# Patient Record
Sex: Male | Born: 1995
Health system: Southern US, Community
[De-identification: ages and names within clinical notes are randomized; demographics above are authoritative.]

## PROBLEM LIST (undated history)

## (undated) HISTORY — PX: MANDIBLE FRACTURE SURGERY: SHX706

---
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
## 2019-09-14 ENCOUNTER — Other Ambulatory Visit: Payer: Self-pay

## 2019-09-14 DIAGNOSIS — Z20822 Contact with and (suspected) exposure to covid-19: Secondary | ICD-10-CM

## 2019-09-15 LAB — NOVEL CORONAVIRUS, NAA: SARS-CoV-2, NAA: NOT DETECTED

## 2020-08-25 ENCOUNTER — Other Ambulatory Visit: Payer: Self-pay

## 2020-08-25 DIAGNOSIS — Z20822 Contact with and (suspected) exposure to covid-19: Secondary | ICD-10-CM

## 2020-08-27 LAB — NOVEL CORONAVIRUS, NAA: SARS-CoV-2, NAA: NOT DETECTED

## 2020-09-08 ENCOUNTER — Other Ambulatory Visit: Payer: Self-pay

## 2020-09-22 ENCOUNTER — Ambulatory Visit (INDEPENDENT_AMBULATORY_CARE_PROVIDER_SITE_OTHER): Payer: Self-pay

## 2020-09-22 DIAGNOSIS — Z23 Encounter for immunization: Secondary | ICD-10-CM

## 2021-06-04 ENCOUNTER — Encounter: Payer: Self-pay | Admitting: Family Medicine

## 2021-06-04 ENCOUNTER — Ambulatory Visit (INDEPENDENT_AMBULATORY_CARE_PROVIDER_SITE_OTHER): Payer: No Typology Code available for payment source | Admitting: Family Medicine

## 2021-06-04 ENCOUNTER — Other Ambulatory Visit: Payer: Self-pay

## 2021-06-04 VITALS — BP 120/80 | HR 77 | Ht 68.0 in | Wt 218.2 lb

## 2021-06-04 DIAGNOSIS — Z13220 Encounter for screening for lipoid disorders: Secondary | ICD-10-CM | POA: Diagnosis not present

## 2021-06-04 DIAGNOSIS — Z0000 Encounter for general adult medical examination without abnormal findings: Secondary | ICD-10-CM | POA: Diagnosis not present

## 2021-06-04 DIAGNOSIS — Z1329 Encounter for screening for other suspected endocrine disorder: Secondary | ICD-10-CM | POA: Diagnosis not present

## 2021-06-04 DIAGNOSIS — Z113 Encounter for screening for infections with a predominantly sexual mode of transmission: Secondary | ICD-10-CM

## 2021-06-04 NOTE — Patient Instructions (Signed)
Preventive Care 21-25 Years Old, Male Preventive care refers to lifestyle choices and visits with your health care provider that can promote health and wellness. This includes: A yearly physical exam. This is also called an annual wellness visit. Regular dental and eye exams. Immunizations. Screening for certain conditions. Healthy lifestyle choices, such as: Eating a healthy diet. Getting regular exercise. Not using drugs or products that contain nicotine and tobacco. Limiting alcohol use. What can I expect for my preventive care visit? Physical exam Your health care provider may check your: Height and weight. These may be used to calculate your BMI (body mass index). BMI is a measurement that tells if you are at a healthy weight. Heart rate and blood pressure. Body temperature. Skin for abnormal spots. Counseling Your health care provider may ask you questions about your: Past medical problems. Family's medical history. Alcohol, tobacco, and drug use. Emotional well-being. Home life and relationship well-being. Sexual activity. Diet, exercise, and sleep habits. Work and work environment. Access to firearms. What immunizations do I need?  Vaccines are usually given at various ages, according to a schedule. Your health care provider will recommend vaccines for you based on your age, medicalhistory, and lifestyle or other factors, such as travel or where you work. What tests do I need? Blood tests Lipid and cholesterol levels. These may be checked every 5 years starting at age 20. Hepatitis C test. Hepatitis B test. Screening  Diabetes screening. This is done by checking your blood sugar (glucose) after you have not eaten for a while (fasting). Genital exam to check for testicular cancer or hernias. STD (sexually transmitted disease) testing, if you are at risk. Talk with your health care provider about your test results, treatment options,and if necessary, the need for more  tests. Follow these instructions at home: Eating and drinking  Eat a healthy diet that includes fresh fruits and vegetables, whole grains, lean protein, and low-fat dairy products. Drink enough fluid to keep your urine pale yellow. Take vitamin and mineral supplements as recommended by your health care provider. Do not drink alcohol if your health care provider tells you not to drink. If you drink alcohol: Limit how much you have to 0-2 drinks a day. Be aware of how much alcohol is in your drink. In the [place] one drink equals one 12 oz bottle of beer (355 mL), one 5 oz glass of wine (148 mL), or one 1 oz glass of hard liquor (44 mL).  Lifestyle Take daily care of your teeth and gums. Brush your teeth every morning and night with fluoride toothpaste. Floss one time each day. Stay active. Exercise for at least 30 minutes 5 or more days each week. Do not use any products that contain nicotine or tobacco, such as cigarettes, e-cigarettes, and chewing tobacco. If you need help quitting, ask your health care provider. Do not use drugs. If you are sexually active, practice safe sex. Use a condom or other form of protection to prevent STIs (sexually transmitted infections). Find healthy ways to cope with stress, such as: Meditation, yoga, or listening to music. Journaling. Talking to a trusted person. Spending time with friends and family. Safety Always wear your seat belt while driving or riding in a vehicle. Do not drive: If you have been drinking alcohol. Do not ride with someone who has been drinking. When you are tired or distracted. While texting. Wear a helmet and other protective equipment during sports activities. If you have firearms in your house, make sure   you follow all gun safety procedures. Seek help if you have been physically or sexually abused. What's next? Go to your health care provider once a year for an annual wellness visit. Ask your health care provider how often  you should have your eyes and teeth checked. Stay up to date on all vaccines. This information is not intended to replace advice given to you by your health care provider. Make sure you discuss any questions you have with your healthcare provider. Document Revised: 08/25/2019 Document Reviewed: 12/03/2018 Elsevier Patient Education  2022 Elsevier Inc.  

## 2021-06-04 NOTE — Progress Notes (Signed)
Subjective:    Patient ID: Randy Andrews, male    DOB: 1996/07/13, 25 y.o.   MRN: [id number]  HPI Chief Complaint  Patient presents with   new pt     New pt get established. Needs cpe for work. No concerns   He is new to the practice and here for a complete physical exam. Last CPE: years ago   Denies any significant past medical history and has no concerns today.  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  Alcohol socially Diet: fairly unhealthy, fasting irregular  Exercise: 6 days per week   Immunizations: Tdap - UTD HPV unsure but thinks he is up to date   Health maintenance:   Last Dental Exam: 2 years ago  Last Eye Exam: years ago   Wears seatbelt always, uses sunscreen, smoke detectors in home and functioning, does not text while driving, feels safe in home environment.  Reviewed allergies, medications, past medical, surgical, family, and social history.   Review of Systems Review of Systems Constitutional: -fever, -chills, -sweats, -unexpected weight change,-fatigue ENT: -runny nose, -ear pain, -sore throat Cardiology:  -chest pain, -palpitations, -edema Respiratory: -cough, -shortness of breath, -wheezing Gastroenterology: -abdominal pain, -nausea, -vomiting, -diarrhea, -constipation  Hematology: -bleeding or bruising problems Musculoskeletal: -arthralgias, -myalgias, -joint swelling, -back pain Ophthalmology: -vision changes Urology: -dysuria, -difficulty urinating, -hematuria, -urinary frequency, -urgency Neurology: -headache, -weakness, -tingling, -numbness       Objective:   Physical Exam BP 120/80   Pulse 77   Ht 5\' 8"  (1.727 m)   Wt 218 lb 3.2 oz (99 kg)   BMI 33.18 kg/m   General Appearance:    Alert, cooperative, no distress, appears stated age  Head:    Normocephalic, without obvious abnormality, atraumatic  Eyes:    PERRL, conjunctiva/corneas clear, EOM's intact  Ears:    Normal  TM's and external ear canals  Nose:   Mask on   Throat:   Mask on   Neck:   Supple, no lymphadenopathy;  thyroid:  no   enlargement/tenderness/nodules; no JVD  Back:    Spine nontender, no curvature, ROM normal, no CVA     tenderness  Lungs:     Clear to auscultation bilaterally without wheezes, rales or     ronchi; respirations unlabored  Chest Wall:    No tenderness or deformity   Heart:    Regular rate and rhythm, S1 and S2 normal, no murmur, rub   or gallop  Breast Exam:    No chest wall tenderness, masses or gynecomastia  Abdomen:     Soft, non-tender, nondistended, normoactive bowel sounds,    no masses, no hepatosplenomegaly  Genitalia:  Declines.  Rectal:   Deferred due to age <40 and lack of symptoms  Extremities:   No clubbing, cyanosis or edema  Pulses:   2+ and symmetric all extremities  Skin:   Skin color, texture, turgor normal, no rashes or lesions  Lymph nodes:   Cervical, supraclavicular, and axillary nodes normal  Neurologic:   CNII-XII intact, normal strength, sensation and gait          Psych:   Normal mood, affect, hygiene and grooming.        Assessment & Plan:   Routine general medical examination at a health care facility - Plan: CBC with Differential/Platelet, Comprehensive metabolic panel, Lipid panel  Screen for STD (sexually transmitted disease) - Plan: HIV Antibody (routine testing w rflx), Hepatitis C antibody, RPR,  GC/Chlamydia Probe Amp, Trichomonas vaginalis, RNA  Screening for lipid disorders - Plan: Lipid panel  Screening for thyroid disorder - Plan: TSH, T4, free, T3  He is a pleasant 25 year old male who is new to the practice and here today for fasting CPE.  He is in his usual state of health.  He is in good spirits.  Counseling on healthy lifestyle including diet and exercise.  I recommend regular self testicular exams due to increased risk for his age group.  Recommend regular dental and eye exams.  Discussed immunizations and he may follow-up  for HPV vaccines if he is not up-to-date.  Counseling on safety and health promotion.  Follow-up pending labs or in 1 year.

## 2021-06-05 ENCOUNTER — Encounter: Payer: Self-pay | Admitting: Family Medicine

## 2021-06-05 DIAGNOSIS — E7800 Pure hypercholesterolemia, unspecified: Secondary | ICD-10-CM | POA: Insufficient documentation

## 2021-06-05 HISTORY — DX: Pure hypercholesterolemia, unspecified: E78.00

## 2021-06-05 LAB — CBC WITH DIFFERENTIAL/PLATELET
Basophils Absolute: 0 10*3/uL (ref 0.0–0.2)
Basos: 0 %
EOS (ABSOLUTE): 0.1 10*3/uL (ref 0.0–0.4)
Eos: 1 %
Hematocrit: 46.2 % (ref 37.5–51.0)
Hemoglobin: 15 g/dL (ref 13.0–17.7)
Immature Grans (Abs): 0 10*3/uL (ref 0.0–0.1)
Immature Granulocytes: 0 %
Lymphocytes Absolute: 1.5 10*3/uL (ref 0.7–3.1)
Lymphs: 13 %
MCH: 27.8 pg (ref 26.6–33.0)
MCHC: 32.5 g/dL (ref 31.5–35.7)
MCV: 86 fL (ref 79–97)
Monocytes Absolute: 0.9 10*3/uL (ref 0.1–0.9)
Monocytes: 7 %
Neutrophils Absolute: 9.1 10*3/uL — ABNORMAL HIGH (ref 1.4–7.0)
Neutrophils: 79 %
Platelets: 295 10*3/uL (ref 150–450)
RBC: 5.39 x10E6/uL (ref 4.14–5.80)
RDW: 12.9 % (ref 11.6–15.4)
WBC: 11.6 10*3/uL — ABNORMAL HIGH (ref 3.4–10.8)

## 2021-06-05 LAB — COMPREHENSIVE METABOLIC PANEL
ALT: 22 IU/L (ref 0–44)
AST: 30 IU/L (ref 0–40)
Albumin/Globulin Ratio: 1.8 (ref 1.2–2.2)
Albumin: 4.9 g/dL (ref 4.1–5.2)
Alkaline Phosphatase: 116 IU/L (ref 44–121)
BUN/Creatinine Ratio: 12 (ref 9–20)
BUN: 12 mg/dL (ref 6–20)
Bilirubin Total: 0.3 mg/dL (ref 0.0–1.2)
CO2: 22 mmol/L (ref 20–29)
Calcium: 9.8 mg/dL (ref 8.7–10.2)
Chloride: 101 mmol/L (ref 96–106)
Creatinine, Ser: 1.01 mg/dL (ref 0.76–1.27)
Globulin, Total: 2.8 g/dL (ref 1.5–4.5)
Glucose: 87 mg/dL (ref 65–99)
Potassium: 4.7 mmol/L (ref 3.5–5.2)
Sodium: 141 mmol/L (ref 134–144)
Total Protein: 7.7 g/dL (ref 6.0–8.5)
eGFR: 107 mL/min/{1.73_m2} (ref >59)

## 2021-06-05 LAB — LIPID PANEL
Chol/HDL Ratio: 3.7 ratio (ref 0.0–5.0)
Cholesterol, Total: 213 mg/dL — ABNORMAL HIGH (ref 100–199)
HDL: 57 mg/dL (ref >39)
LDL Chol Calc (NIH): 137 mg/dL — ABNORMAL HIGH (ref 0–99)
Triglycerides: 106 mg/dL (ref 0–149)
VLDL Cholesterol Cal: 19 mg/dL (ref 5–40)

## 2021-06-05 LAB — HIV ANTIBODY (ROUTINE TESTING W REFLEX): HIV Screen 4th Generation wRfx: NONREACTIVE

## 2021-06-05 LAB — RPR: RPR Ser Ql: NONREACTIVE

## 2021-06-05 LAB — TSH: TSH: 2.72 u[IU]/mL (ref 0.450–4.500)

## 2021-06-05 LAB — T3: T3, Total: 144 ng/dL (ref 71–180)

## 2021-06-05 LAB — T4, FREE: Free T4: 1.25 ng/dL (ref 0.82–1.77)

## 2021-06-05 LAB — HEPATITIS C ANTIBODY: Hep C Virus Ab: 0.2 s/co ratio (ref 0.0–0.9)

## 2021-06-06 LAB — TRICHOMONAS VAGINALIS, PROBE AMP: Trich vag by NAA: NEGATIVE

## 2021-06-06 LAB — GC/CHLAMYDIA PROBE AMP
Chlamydia trachomatis, NAA: NEGATIVE
Neisseria Gonorrhoeae by PCR: NEGATIVE

## 2021-06-10 NOTE — Progress Notes (Signed)
I received a message that he has not seen his mychart results or my recommendations. Please check

## 2022-04-22 ENCOUNTER — Ambulatory Visit: Payer: Self-pay | Admitting: Family Medicine

## 2022-06-07 ENCOUNTER — Encounter: Payer: No Typology Code available for payment source | Admitting: Family Medicine

## 2022-06-12 ENCOUNTER — Encounter: Payer: Self-pay | Admitting: Physician Assistant

## 2022-06-12 ENCOUNTER — Ambulatory Visit (INDEPENDENT_AMBULATORY_CARE_PROVIDER_SITE_OTHER): Payer: No Typology Code available for payment source | Admitting: Physician Assistant

## 2022-06-12 VITALS — BP 130/90 | HR 77 | Ht 68.0 in | Wt 216.4 lb

## 2022-06-12 DIAGNOSIS — D72829 Elevated white blood cell count, unspecified: Secondary | ICD-10-CM

## 2022-06-12 DIAGNOSIS — Z113 Encounter for screening for infections with a predominantly sexual mode of transmission: Secondary | ICD-10-CM

## 2022-06-12 DIAGNOSIS — Z0000 Encounter for general adult medical examination without abnormal findings: Secondary | ICD-10-CM

## 2022-06-12 DIAGNOSIS — E785 Hyperlipidemia, unspecified: Secondary | ICD-10-CM | POA: Diagnosis not present

## 2022-06-12 NOTE — Patient Instructions (Signed)
Preventive Care 21-26 Years Old, Male Preventive care refers to lifestyle choices and visits with your health care provider that can promote health and wellness. This includes: A yearly physical exam. This is also called an annual wellness visit. Regular dental and eye exams. Immunizations. Screening for certain conditions. Healthy lifestyle choices, such as: Eating a healthy diet. Getting regular exercise. Not using drugs or products that contain nicotine and tobacco. Limiting alcohol use. What can I expect for my preventive care visit? Physical exam Your health care provider may check your: Height and weight. These may be used to calculate your BMI (body mass index). BMI is a measurement that tells if you are at a healthy weight. Heart rate and blood pressure. Body temperature. Skin for abnormal spots. Counseling Your health care provider may ask you questions about your: Past medical problems. Family's medical history. Alcohol, tobacco, and drug use. Emotional well-being. Home life and relationship well-being. Sexual activity. Diet, exercise, and sleep habits. Work and work environment. Access to firearms. What immunizations do I need?  Vaccines are usually given at various ages, according to a schedule. Your health care provider will recommend vaccines for you based on your age, medicalhistory, and lifestyle or other factors, such as travel or where you work. What tests do I need? Blood tests Lipid and cholesterol levels. These may be checked every 5 years starting at age 20. Hepatitis C test. Hepatitis B test. Screening  Diabetes screening. This is done by checking your blood sugar (glucose) after you have not eaten for a while (fasting). Genital exam to check for testicular cancer or hernias. STD (sexually transmitted disease) testing, if you are at risk. Talk with your health care provider about your test results, treatment options,and if necessary, the need for more  tests. Follow these instructions at home: Eating and drinking  Eat a healthy diet that includes fresh fruits and vegetables, whole grains, lean protein, and low-fat dairy products. Drink enough fluid to keep your urine pale yellow. Take vitamin and mineral supplements as recommended by your health care provider. Do not drink alcohol if your health care provider tells you not to drink. If you drink alcohol: Limit how much you have to 0-2 drinks a day. Be aware of how much alcohol is in your drink. In the [place] one drink equals one 12 oz bottle of beer (355 mL), one 5 oz glass of wine (148 mL), or one 1 oz glass of hard liquor (44 mL).  Lifestyle Take daily care of your teeth and gums. Brush your teeth every morning and night with fluoride toothpaste. Floss one time each day. Stay active. Exercise for at least 30 minutes 5 or more days each week. Do not use any products that contain nicotine or tobacco, such as cigarettes, e-cigarettes, and chewing tobacco. If you need help quitting, ask your health care provider. Do not use drugs. If you are sexually active, practice safe sex. Use a condom or other form of protection to prevent STIs (sexually transmitted infections). Find healthy ways to cope with stress, such as: Meditation, yoga, or listening to music. Journaling. Talking to a trusted person. Spending time with friends and family. Safety Always wear your seat belt while driving or riding in a vehicle. Do not drive: If you have been drinking alcohol. Do not ride with someone who has been drinking. When you are tired or distracted. While texting. Wear a helmet and other protective equipment during sports activities. If you have firearms in your house, make sure   you follow all gun safety procedures. Seek help if you have been physically or sexually abused. What's next? Go to your health care provider once a year for an annual wellness visit. Ask your health care provider how often  you should have your eyes and teeth checked. Stay up to date on all vaccines. This information is not intended to replace advice given to you by your health care provider. Make sure you discuss any questions you have with your healthcare provider. Document Revised: 08/25/2019 Document Reviewed: 12/03/2018 Elsevier Patient Education  2022 Elsevier Inc.  

## 2022-06-12 NOTE — Progress Notes (Unsigned)
statin  Complete physical exam   Patient: Randy Andrews   DOB: 01-Dec-1996   25 y.o. Male  MRN: [id number] Visit Date: 06/12/2022  Chief Complaint  Patient presents with   Annual Exam    Fasting CPE- No concerns   Subjective    Randy Andrews is a 26 y.o. male who presents today for a complete physical exam.   Reports is generally feeling well; is eating a intermittent fasting/healthy diet; is sleeping well 4 - 5; drinks 60+ ounces bottles of water a day; is exercising walking dog   HPI HPI     Annual Exam    Additional comments: Fasting CPE- No concerns      Last edited by Sammuel Cooper, CMA on 06/12/2022  8:23 AM.      ***  Past Medical History:  Diagnosis Date   Elevated LDL cholesterol level 06/05/2021   Past Surgical History:  Procedure Laterality Date   MANDIBLE FRACTURE SURGERY     Social History   Socioeconomic History   Marital status: Single    Spouse name: Not on file   Number of children: Not on file   Years of education: Not on file   Highest education level: Not on file  Occupational History   Not on file  Tobacco Use   Smoking status: Unknown   Smokeless tobacco: Not on file  Substance and Sexual Activity   Alcohol use: Yes   Drug use: Never   Sexual activity: Yes  Other Topics Concern   Not on file  Social History Narrative   Not on file   Social Determinants of Health   Financial Resource Strain: Not on file  Food Insecurity: Not on file  Transportation Needs: Not on file  Physical Activity: Not on file  Stress: Not on file  Social Connections: Not on file  Intimate Partner Violence: Not on file   Family Status  Relation Name Status   Mother  Alive   Father  Alive   Brother 3 Alive   MGM  Alive   Family History  Problem Relation Age of Onset   Depression Mother    Cancer Maternal Grandmother    No Known Allergies  Patient Care Team: Lexine Baton as PCP - General (Physician Assistant)   Medications: No  outpatient medications prior to visit.   No facility-administered medications prior to visit.    Review of Systems  {Labs (Optional):23779}  The ASCVD Risk score (Arnett DK, et al., 2019) failed to calculate for the following reasons:   The 2019 ASCVD risk score is only valid for ages 43 to 21   Objective    BP 130/90   Pulse 77   Wt 216 lb 6.4 oz (98.2 kg)   SpO2 99%   BMI 32.90 kg/m   {Show previous vital signs (optional):23777}   Physical Exam  ***  Last depression screening scores    06/12/2022    8:23 AM 06/04/2021    8:08 AM  PHQ 2/9 Scores  PHQ - 2 Score 0 0   Last fall risk screening    06/12/2022    8:23 AM  Fall Risk   Falls in the past year? 0  Number falls in past yr: 0  Injury with Fall? 0  Risk for fall due to : No Fall Risks  Follow up Falls evaluation completed     No results found for any visits on 06/12/22.  Assessment & Plan    Routine Health  Maintenance and Physical Exam  Exercise Activities and Dietary recommendations  Goals   None     Immunization History  Administered Date(s) Administered   PFIZER(Purple Top)SARS-COV-2 Vaccination 12/28/2019, 01/17/2020, 09/22/2020   PPD Test 05/08/2018    Health Maintenance  Topic Date Due   HPV VACCINES (1 - Male 2-dose series) Never done   TETANUS/TDAP  Never done   COVID-19 Vaccine (4 - Pfizer series) 06/28/2022 (Originally 11/17/2020)   INFLUENZA VACCINE  07/23/2022   Hepatitis C Screening  Completed   HIV Screening  Completed    Discussed health benefits of physical activity, and encouraged him to engage in regular exercise appropriate for his age and condition.  Problem List Items Addressed This Visit   None    No follow-ups on file.     Jake Shark, PA-C

## 2022-06-14 LAB — COMPREHENSIVE METABOLIC PANEL
ALT: 27 IU/L (ref 0–44)
AST: 21 IU/L (ref 0–40)
Albumin/Globulin Ratio: 1.6 (ref 1.2–2.2)
Albumin: 4.9 g/dL (ref 4.1–5.2)
Alkaline Phosphatase: 101 IU/L (ref 44–121)
BUN/Creatinine Ratio: 13 (ref 9–20)
BUN: 11 mg/dL (ref 6–20)
Bilirubin Total: 0.4 mg/dL (ref 0.0–1.2)
CO2: 24 mmol/L (ref 20–29)
Calcium: 9.8 mg/dL (ref 8.7–10.2)
Chloride: 101 mmol/L (ref 96–106)
Creatinine, Ser: 0.87 mg/dL (ref 0.76–1.27)
Globulin, Total: 3 g/dL (ref 1.5–4.5)
Glucose: 93 mg/dL (ref 70–99)
Potassium: 5 mmol/L (ref 3.5–5.2)
Sodium: 138 mmol/L (ref 134–144)
Total Protein: 7.9 g/dL (ref 6.0–8.5)
eGFR: 123 mL/min/{1.73_m2} (ref >59)

## 2022-06-14 LAB — CBC WITH DIFFERENTIAL/PLATELET
Basophils Absolute: 0.1 10*3/uL (ref 0.0–0.2)
Basos: 1 %
EOS (ABSOLUTE): 0.1 10*3/uL (ref 0.0–0.4)
Eos: 2 %
Hematocrit: 43.8 % (ref 37.5–51.0)
Hemoglobin: 14.9 g/dL (ref 13.0–17.7)
Immature Grans (Abs): 0 10*3/uL (ref 0.0–0.1)
Immature Granulocytes: 0 %
Lymphocytes Absolute: 2.6 10*3/uL (ref 0.7–3.1)
Lymphs: 37 %
MCH: 28.3 pg (ref 26.6–33.0)
MCHC: 34 g/dL (ref 31.5–35.7)
MCV: 83 fL (ref 79–97)
Monocytes Absolute: 0.6 10*3/uL (ref 0.1–0.9)
Monocytes: 9 %
Neutrophils Absolute: 3.5 10*3/uL (ref 1.4–7.0)
Neutrophils: 51 %
Platelets: 303 10*3/uL (ref 150–450)
RBC: 5.27 x10E6/uL (ref 4.14–5.80)
RDW: 12.4 % (ref 11.6–15.4)
WBC: 6.8 10*3/uL (ref 3.4–10.8)

## 2022-06-14 LAB — RPR+HIV+GC+CT PANEL
Chlamydia trachomatis, NAA: NEGATIVE
HIV Screen 4th Generation wRfx: NONREACTIVE
Neisseria Gonorrhoeae by PCR: NEGATIVE
RPR Ser Ql: NONREACTIVE

## 2022-06-14 LAB — LIPID PANEL
Chol/HDL Ratio: 4.7 ratio (ref 0.0–5.0)
Cholesterol, Total: 231 mg/dL — ABNORMAL HIGH (ref 100–199)
HDL: 49 mg/dL (ref >39)
LDL Chol Calc (NIH): 163 mg/dL — ABNORMAL HIGH (ref 0–99)
Triglycerides: 105 mg/dL (ref 0–149)
VLDL Cholesterol Cal: 19 mg/dL (ref 5–40)

## 2022-09-12 ENCOUNTER — Ambulatory Visit (INDEPENDENT_AMBULATORY_CARE_PROVIDER_SITE_OTHER): Payer: No Typology Code available for payment source | Admitting: Medical

## 2022-09-12 VITALS — BP 120/80 | HR 82 | Wt 212.0 lb

## 2022-09-12 DIAGNOSIS — G479 Sleep disorder, unspecified: Secondary | ICD-10-CM | POA: Diagnosis not present

## 2022-09-12 DIAGNOSIS — R0683 Snoring: Secondary | ICD-10-CM | POA: Diagnosis not present

## 2022-09-12 DIAGNOSIS — E7800 Pure hypercholesterolemia, unspecified: Secondary | ICD-10-CM

## 2022-09-12 DIAGNOSIS — L210 Seborrhea capitis: Secondary | ICD-10-CM | POA: Diagnosis not present

## 2022-09-12 MED ORDER — KETOCONAZOLE 2 % EX SHAM: 1.0000 | MEDICATED_SHAMPOO | CUTANEOUS | 0 refills | Status: DC

## 2022-09-12 NOTE — Progress Notes (Signed)
Subjective: Chief Complaint  Patient presents with   mulitiple issues    Multiple issues- dandruff all the time- over a year but getting worse and itching. Discuss slee apnea- snoring loud, and stopped breathing in sleep, gasp for her, follow-up on cholesterol   Here for several issues.   This is my first visit with him today  Has concerns for sleep apnea.  For years has been told about loud snoring from friends and mother.  He endorses daytime somnolence.  Some days feels rested, sometimes not.   Works schedule can interfere with sleep schedule as well.   There has been concern about apnea or gasping.    Works first shift, but sometimes has trouble getting to sleep.   Exercises sometimes late evening to wear him self and make him tired.   No prior sleep study. No family members with sleep apnea.   Here for f/u on cholesterol.   No prior medication.  Since last visit is working to do better with low cholesterol diet.   Using a lot more cooking at home.   No family hx/o heart disease.  Using [employer] meal prep.  He notes concerns about dandruff.  He has long wavy hair.  He has tried various things in the past including [employer], Shea butter, over-the-counter psoriasis shampoo and others.  Washing his hair more frequently seems to make it worse.  Sometimes worse sometimes better overall despite what he is use.  Past Medical History:  Diagnosis Date   Elevated LDL cholesterol level 06/05/2021   No current outpatient medications on file prior to visit.   No current facility-administered medications on file prior to visit.   ROS as in subjective    Objective: BP 120/80   Pulse 82   Wt 212 lb (96.2 kg)   BMI 32.23 kg/m   Wt Readings from Last 3 Encounters:  09/12/22 212 lb (96.2 kg)  06/12/22 216 lb 6.4 oz (98.2 kg)  06/04/21 218 lb 3.2 oz (99 kg)   General appearence: alert, no distress, WD/WN,  He does have some large tonsils but has a open airway Neck: supple, no  lymphadenopathy, no thyromegaly, no masses Heart: RRR, normal S1, S2, no murmurs Lungs: CTA bilaterally, no wheezes, rhonchi, or rales Pulses: 2+ symmetric, upper and lower extremities, normal cap refill Skin: Along the perimeter of the scalp and within the scalp he has an delineated area of pinkish-red irritated skin but no obvious flaking or scaling      Assessment: Encounter Diagnoses  Name Primary?   Snoring Yes   Sleep disturbance    Elevated LDL cholesterol level    Dandruff      Plan: Snoring, sleep disturbance - referral for home sleep study, work on weight loss, avoid supine position in bed.  Discussed next steps  Elevated LDL - he has been working on dietary changes, regular exercise.  Continue these measures.  Recheck lipids next year.  Dandruff- begin Nizoral shampoo twice weekly.  Discussed proper use of medication   Randy Andrews was seen today for mulitiple issues.  Diagnoses and all orders for this visit:  Snoring  Sleep disturbance  Elevated LDL cholesterol level  Dandruff  Other orders -     ketoconazole (NIZORAL) 2 % shampoo; Apply 1 Application topically 2 (two) times a week.    F/u pending sleep study

## 2022-09-12 NOTE — Progress Notes (Signed)
I have put in order for home sleep test through [facility] since he has [employer]

## 2022-11-05 ENCOUNTER — Encounter: Payer: Self-pay | Admitting: Internal Medicine

## 2023-03-23 ENCOUNTER — Other Ambulatory Visit: Payer: Self-pay | Admitting: Medical

## 2023-03-24 ENCOUNTER — Other Ambulatory Visit: Payer: Self-pay | Admitting: Internal Medicine

## 2023-03-24 MED ORDER — KETOCONAZOLE 2 % EX SHAM: 1.0000 | MEDICATED_SHAMPOO | CUTANEOUS | 0 refills | Status: DC

## 2023-03-24 NOTE — Telephone Encounter (Signed)
From: [place] Gave To: Office of Dorothea Ogle, [place] Sent: 03/20/2023 10:31 PM EDT Subject: Medication Renewal Request  Refills have been requested for the following medications:   ketoconazole (NIZORAL) 2 % shampoo [Shane Tysinger]  Preferred pharmacy: [id number] - [address], [address] AT [facility] OF [address] [facility] Delivery method: [employer]

## 2023-08-08 ENCOUNTER — Encounter: Payer: Self-pay | Admitting: Medical

## 2023-08-08 ENCOUNTER — Ambulatory Visit (INDEPENDENT_AMBULATORY_CARE_PROVIDER_SITE_OTHER): Payer: 59 | Admitting: Medical

## 2023-08-08 VITALS — BP 110/70 | HR 79 | Wt 224.8 lb

## 2023-08-08 DIAGNOSIS — Z13220 Encounter for screening for lipoid disorders: Secondary | ICD-10-CM

## 2023-08-08 DIAGNOSIS — Z0000 Encounter for general adult medical examination without abnormal findings: Secondary | ICD-10-CM

## 2023-08-08 DIAGNOSIS — Z113 Encounter for screening for infections with a predominantly sexual mode of transmission: Secondary | ICD-10-CM

## 2023-08-08 DIAGNOSIS — Z7185 Encounter for immunization safety counseling: Secondary | ICD-10-CM

## 2023-08-08 NOTE — Progress Notes (Signed)
Subjective:   HPI  Randy Andrews is a 27 y.o. male who presents for Chief Complaint  Patient presents with   nonfasting cpe     Nonfasting cpe, no concerns    Patient Care Team: Hasini Peachey, Cleda Mccreedy as PCP - General (Family Medicine) No eye doctor Sees dentist   Concerns: Non fasting.   Ate a taco on the way here but otherwise NPO since yesterday  Eats a lot of steak, not a lot of fruits and vegetables   Reviewed their medical, surgical, family, social, medication, and allergy history and updated chart as appropriate.  No Known Allergies  Past Medical History:  Diagnosis Date   Elevated LDL cholesterol level 06/05/2021    Current Outpatient Medications on File Prior to Visit  Medication Sig Dispense Refill   ketoconazole (NIZORAL) 2 % shampoo Apply 1 Application topically 2 (two) times a week. (Patient not taking: Reported on 08/08/2023) 120 mL 0   No current facility-administered medications on file prior to visit.      Current Outpatient Medications:    ketoconazole (NIZORAL) 2 % shampoo, Apply 1 Application topically 2 (two) times a week. (Patient not taking: Reported on 08/08/2023), Disp: 120 mL, Rfl: 0  Family History  Problem Relation Age of Onset   Depression Mother    Hyperlipidemia Father    Cancer Maternal Grandmother    Diabetes type II Neg Hx    Heart attack Neg Hx    Stroke Neg Hx    Hypertension Neg Hx    Prostate cancer Neg Hx     Past Surgical History:  Procedure Laterality Date   MANDIBLE FRACTURE SURGERY      Review of Systems  Constitutional:  Negative for chills, fever, malaise/fatigue and weight loss.  HENT:  Negative for congestion, ear pain, hearing loss, sore throat and tinnitus.   Eyes:  Negative for blurred vision, pain and redness.  Respiratory:  Negative for cough, hemoptysis and shortness of breath.   Cardiovascular:  Negative for chest pain, palpitations, orthopnea, claudication and leg swelling.  Gastrointestinal:   Negative for abdominal pain, blood in stool, constipation, diarrhea, nausea and vomiting.  Genitourinary:  Negative for dysuria, flank pain, frequency, hematuria and urgency.  Musculoskeletal:  Negative for falls, joint pain and myalgias.  Skin:  Negative for itching and rash.  Neurological:  Negative for dizziness, tingling, speech change, weakness and headaches.  Endo/Heme/Allergies:  Negative for polydipsia. Does not bruise/bleed easily.  Psychiatric/Behavioral:  Negative for depression and memory loss. The patient is not nervous/anxious and does not have insomnia.         Objective:  BP 110/70   Pulse 79   Wt 224 lb 12.8 oz (102 kg)   BMI 34.18 kg/m   General appearance: alert, no distress, WD/WN, male Skin: unremarkable HEENT: normocephalic, conjunctiva/corneas normal, sclerae anicteric, PERRLA, EOMi, nares patent, no discharge or erythema, pharynx normal Oral cavity: MMM, tongue normal, teeth normal Neck: supple, no lymphadenopathy, no thyromegaly, no masses, normal ROM, no bruits Chest: non tender, normal shape and expansion Heart: RRR, normal S1, S2, no murmurs Lungs: CTA bilaterally, no wheezes, rhonchi, or rales Abdomen: +bs, soft, non tender, non distended, no masses, no hepatomegaly, no splenomegaly, no bruits Back: non tender, normal ROM, no scoliosis Musculoskeletal: upper extremities non tender, no obvious deformity, normal ROM throughout, lower extremities non tender, no obvious deformity, normal ROM throughout Extremities: no edema, no cyanosis, no clubbing Pulses: 2+ symmetric, upper and lower extremities, normal cap refill Neurological: alert,  oriented x 3, CN2-12 intact, strength normal upper extremities and lower extremities, sensation normal throughout, DTRs 2+ throughout, no cerebellar signs, gait normal Psychiatric: normal affect, behavior normal, pleasant  GU: normal male external genitalia,uncircumcised, nontender, no masses, no hernia, no  lymphadenopathy Rectal: deferred    Assessment and Plan :   Encounter Diagnoses  Name Primary?   Encounter for health maintenance examination in adult Yes   Screening for lipid disorders    Vaccine counseling    Screen for STD (sexually transmitted disease)     This visit was a preventative care visit, also known as wellness visit or routine physical.   Topics typically include healthy lifestyle, diet, exercise, preventative care, vaccinations, sick and well care, proper use of emergency dept and after hours care, as well as other concerns.     Separate significant issues discussed: Counseled on diet, try to get more fruits and vegetables in diet, less meat and less high cholesterol foods    General Recommendations: Continue to return yearly for your annual wellness and preventative care visits.  This gives [place] a chance to discuss healthy lifestyle, exercise, vaccinations, review your chart record, and perform screenings where appropriate.  I recommend you see your eye doctor yearly for routine vision care.  I recommend you see your dentist yearly for routine dental care including hygiene visits twice yearly.   Vaccination  Immunization History  Administered Date(s) Administered   PFIZER(Purple Top)SARS-COV-2 Vaccination 12/28/2019, 01/17/2020, 09/22/2020   PPD Test 05/08/2018   Tdap 06/20/2020    Vaccine recommendations: Consider yearly flu vaccine    Screening for cancer: Colon cancer screening: Age 1  Testicular cancer screening You should do a monthly self testicular exam if you are between 1-27 years old, and we typically do a testicular exam on the yearly physical for this same age group.   Prostate Cancer screening: The recommended prostate cancer screening test is a blood test called the prostate-specific antigen (PSA) test. PSA is a protein that is made in the prostate. As you age, your prostate naturally produces more PSA. Abnormally high PSA levels may  be caused by: Prostate cancer. An enlarged prostate that is not caused by cancer (benign prostatic hyperplasia, or BPH). This condition is very common in older men. A prostate gland infection (prostatitis) or urinary tract infection. Certain medicines such as male hormones (like testosterone) or other medicines that raise testosterone levels. A rectal exam may be done as part of prostate cancer screening to help provide information about the size of your prostate gland. When a rectal exam is performed, it should be done after the PSA level is drawn to avoid any effect on the results.   Skin cancer screening: Check your skin regularly for new changes, growing lesions, or other lesions of concern Come in for evaluation if you have skin lesions of concern.   Lung cancer screening: If you have a greater than 20 pack year history of tobacco use, then you may qualify for lung cancer screening with a chest CT scan.   Please call your insurance company to inquire about coverage for this test.   Pancreatic cancer:  no current screening test is available or routinely recommended. (risk factors: smoking, overweight or obese, diabetes, chronic pancreatitis, work exposure - dry cleaning, metal working, 27yo>, M>F, Tree surgeon, family hx/o, hereditary breast, ovarian, melanoma, lynch, peutz-jeghers).  Symptoms: jaundice, dark urine, light color or greasy stools, itchy skin, belly or back pain, weight loss, poor appetite, nausea, vomiting, liver enlargement, DVT/blood  clots.   We currently don't have screenings for other cancers besides breast, cervical, colon, and lung cancers.  If you have a strong family history of cancer or have other cancer screening concerns, please let me know.  Genetic testing referral is an option for individuals with high cancer risk in the family.  There are some other cancer screenings in development currently.   Bone health: Get at least 150 minutes of aerobic exercise  weekly Get weight bearing exercise at least once weekly Bone density test:  A bone density test is an imaging test that uses a type of X-ray to measure the amount of calcium and other minerals in your bones. The test may be used to diagnose or screen you for a condition that causes weak or thin bones (osteoporosis), predict your risk for a broken bone (fracture), or determine how well your osteoporosis treatment is working. The bone density test is recommended for females 65 and older, or females or males <65 if certain risk factors such as thyroid disease, long term use of steroids such as for asthma or rheumatological issues, vitamin D deficiency, estrogen deficiency, family history of osteoporosis, self or family history of fragility fracture in first degree relative.    Heart health: Get at least 150 minutes of aerobic exercise weekly Limit alcohol It is important to maintain a healthy blood pressure and healthy cholesterol numbers  Heart disease screening: Screening for heart disease includes screening for blood pressure, fasting lipids, glucose/diabetes screening, BMI height to weight ratio, reviewed of smoking status, physical activity, and diet.    Goals include blood pressure 120/80 or less, maintaining a healthy lipid/cholesterol profile, preventing diabetes or keeping diabetes numbers under good control, not smoking or using tobacco products, exercising most days per week or at least 150 minutes per week of exercise, and eating healthy variety of fruits and vegetables, healthy oils, and avoiding unhealthy food choices like fried food, fast food, high sugar and high cholesterol foods.     Vascular disease screening: For higher risk individuals including smokers, diabetics, patients with known heart disease or high blood pressure, kidney disease, and others, screening for vascular disease or atherosclerosis of the arteries is available.  Examples may include carotid ultrasound,  abdominal aortic ultrasound, ABI blood flow screening in the legs, thoracic aorta screening.   Medical care options: I recommend you continue to seek care here first for routine care.  We try really hard to have available appointments Monday through Friday daytime hours for sick visits, acute visits, and physicals.  Urgent care should be used for after hours and weekends for significant issues that cannot wait till the next day.  The emergency department should be used for significant potentially life-threatening emergencies.  The emergency department is expensive, can often have long wait times for less significant concerns, so try to utilize primary care, urgent care, or telemedicine when possible to avoid unnecessary trips to the emergency department.  Virtual visits and telemedicine have been introduced since the pandemic started in 2020, and can be convenient ways to receive medical care.  We offer virtual appointments as well to assist you in a variety of options to seek medical care.   Legal  Take the time to do a last will and testament, Advanced Directives including Health Care Power of Attorney and Living Will documents.  Don't leave your family with burdens that can be handled ahead of time.   Spiritual and Emotional Health Keeping a healthy spiritual life can help you better  manage your physical health. Your spiritual life can help you to cope with any issues that may arise with your physical health.  Balance can keep [place] healthy and help [place] to recover.  If you are struggling with your spiritual health there are questions that you may want to ask yourself:  What makes me feel most complete? When do I feel most connected to the rest of the world? Where do I find the most inner strength? What am I doing when I feel whole?  Helpful tips: Being in nature. Some people feel very connected and at peace when they are walking outdoors or are outside. Helping others. Some feel the largest  sense of wellbeing when they are of service to others. Being of service can take on many forms. It can be doing volunteer work, being kind to strangers, or offering a hand to a friend in need. Gratitude. Some people find they feel the most connected when they remain grateful. They may make lists of all the things they are grateful for or say a thank you out loud for all they have.    Emotional Health Are you in tune with your emotional health?  Check out this link: http://www.marquez-love.com/    Financial Health Make sure you use a budget for your personal finances Make sure you are insured against risks (health insurance, life insurance, auto insurance, etc) Save more, spend less Set financial goals If you need help in this area, good resources include counseling through [employer] or other community resources, have a meeting with a Social research officer, government, and a good resource is the [facility] "Marv" was seen today for nonfasting cpe .  Diagnoses and all orders for this visit:  Encounter for health maintenance examination in adult -     Comprehensive metabolic panel -     CBC -     Lipid panel -     TSH -     RPR+HIV+GC+CT Panel -     Hepatitis C antibody -     Hepatitis B surface antigen  Screening for lipid disorders -     Lipid panel  Vaccine counseling  Screen for STD (sexually transmitted disease) -     RPR+HIV+GC+CT Panel -     Hepatitis C antibody -     Hepatitis B surface antigen     Follow-up pending labs, yearly for physical

## 2023-08-11 NOTE — Progress Notes (Signed)
Results sent through MyChart

## 2023-08-13 LAB — LIPID PANEL
Chol/HDL Ratio: 5.3 ratio — ABNORMAL HIGH (ref 0.0–5.0)
Cholesterol, Total: 255 mg/dL — ABNORMAL HIGH (ref 100–199)
HDL: 48 mg/dL (ref >39)
LDL Chol Calc (NIH): 178 mg/dL — ABNORMAL HIGH (ref 0–99)
Triglycerides: 158 mg/dL — ABNORMAL HIGH (ref 0–149)
VLDL Cholesterol Cal: 29 mg/dL (ref 5–40)

## 2023-08-13 LAB — COMPREHENSIVE METABOLIC PANEL
ALT: 32 IU/L (ref 0–44)
AST: 33 IU/L (ref 0–40)
Albumin: 4.8 g/dL (ref 4.3–5.2)
Alkaline Phosphatase: 104 IU/L (ref 44–121)
BUN/Creatinine Ratio: 14 (ref 9–20)
BUN: 15 mg/dL (ref 6–20)
Bilirubin Total: 0.5 mg/dL (ref 0.0–1.2)
CO2: 23 mmol/L (ref 20–29)
Calcium: 9.9 mg/dL (ref 8.7–10.2)
Chloride: 101 mmol/L (ref 96–106)
Creatinine, Ser: 1.08 mg/dL (ref 0.76–1.27)
Globulin, Total: 3 g/dL (ref 1.5–4.5)
Glucose: 94 mg/dL (ref 70–99)
Potassium: 4.2 mmol/L (ref 3.5–5.2)
Sodium: 140 mmol/L (ref 134–144)
Total Protein: 7.8 g/dL (ref 6.0–8.5)
eGFR: 96 mL/min/{1.73_m2} (ref >59)

## 2023-08-13 LAB — CBC
Hematocrit: 43.3 % (ref 37.5–51.0)
Hemoglobin: 14.4 g/dL (ref 13.0–17.7)
MCH: 28.1 pg (ref 26.6–33.0)
MCHC: 33.3 g/dL (ref 31.5–35.7)
MCV: 84 fL (ref 79–97)
Platelets: 313 10*3/uL (ref 150–450)
RBC: 5.13 x10E6/uL (ref 4.14–5.80)
RDW: 12.7 % (ref 11.6–15.4)
WBC: 7.5 10*3/uL (ref 3.4–10.8)

## 2023-08-13 LAB — RPR+HIV+GC+CT PANEL
Chlamydia trachomatis, NAA: NEGATIVE
HIV Screen 4th Generation wRfx: NONREACTIVE
Neisseria Gonorrhoeae by PCR: NEGATIVE
RPR Ser Ql: NONREACTIVE

## 2023-08-13 LAB — HEPATITIS C ANTIBODY: Hep C Virus Ab: NONREACTIVE

## 2023-08-13 LAB — HEPATITIS B SURFACE ANTIGEN: Hepatitis B Surface Ag: NEGATIVE

## 2023-08-13 LAB — TSH: TSH: 1.88 u[IU]/mL (ref 0.450–4.500)

## 2023-08-13 NOTE — Progress Notes (Signed)
Results sent through MyChart

## 2023-12-26 ENCOUNTER — Ambulatory Visit: Payer: 59 | Admitting: Medical

## 2023-12-26 ENCOUNTER — Encounter: Payer: Self-pay | Admitting: Medical

## 2023-12-26 ENCOUNTER — Other Ambulatory Visit: Payer: Self-pay

## 2023-12-26 VITALS — BP 112/74 | HR 70 | Ht 67.0 in | Wt 215.2 lb

## 2023-12-26 DIAGNOSIS — R0683 Snoring: Secondary | ICD-10-CM

## 2023-12-26 DIAGNOSIS — L210 Seborrhea capitis: Secondary | ICD-10-CM | POA: Diagnosis not present

## 2023-12-26 DIAGNOSIS — R400 Somnolence: Secondary | ICD-10-CM | POA: Diagnosis not present

## 2023-12-26 DIAGNOSIS — L989 Disorder of the skin and subcutaneous tissue, unspecified: Secondary | ICD-10-CM

## 2023-12-26 MED ORDER — KETOCONAZOLE 2 % EX SHAM: 1.0000 | MEDICATED_SHAMPOO | CUTANEOUS | 1 refills | Status: DC

## 2023-12-26 MED ORDER — FLUOCINOLONE ACETONIDE 0.01 % EX SHAM: 1.0000 | MEDICATED_SHAMPOO | CUTANEOUS | 1 refills | Status: DC

## 2023-12-26 NOTE — Progress Notes (Signed)
 Subjective:  Randy Andrews is a 28 y.o. male who presents for Chief Complaint  Patient presents with   Referral    Sleep Study and Dermatologist     Here for ongoing problems of dandruff.  We discussed this issue back in September 2023.  He ended up using Nizoral  shampoo that did work to clear of his ischial into different sequences of time that here.  Lately the symptoms are back again really bad.  Getting flaky dandruff all the time.  He is using hypoallergenic type shampoos  He also never heard back from sleep to referral back in 2023.  He still has concern for snoring and daytime somnolence and wants to pursue sleep study.  No other aggravating or relieving factors.    No other c/o.  The following portions of the patient's history were reviewed and updated as appropriate: allergies, current medications, past family history, past medical history, past social history, past surgical history and problem list.  ROS Otherwise as in subjective above    Objective: BP 112/74   Pulse 70   Ht 5' 7 (1.702 m)   Wt 215 lb 3.2 oz (97.6 kg)   SpO2 98%   BMI 33.71 kg/m   General appearance: alert, no distress, well developed, well nourished Scalp with scattered dandruff and scattered irritated flaky skin and scalp    Assessment: Encounter Diagnoses  Name Primary?   Dandruff Yes   Dermatosis of scalp    Snoring    Daytime somnolence      Plan: Dandruff and dermatosis of scalp-prior success with Nizoral  shampoo.  We will do another round of Nizoral  shampoo 2 times a week, lather and, leave on for 10 minutes then rinse out.  Will also begin trial of fluocinolone  steroid shampoo 2 to 3 days/week on separate days from the Nizoral .  Referral also placed to dermatology for follow-up  Snoring, daytime somnolence-somehow the referral did not get completed back in 2023 in the fall.  Updated referral today.  Discussed general measures to help with sleep apnea such as elevating head of the  bed, avoiding sleeping supine flat in the back, maintaining healthy weight, avoiding alcohol and other sedating medications at the time for the potential worsening of sleep apnea  Advise if he does not hear back about these referrals in the next 10 days to please call our office  Hy Randy Andrews was seen today for referral.  Diagnoses and all orders for this visit:  Dandruff -     Ambulatory referral to Dermatology  Dermatosis of scalp -     Ambulatory referral to Dermatology  Snoring  Daytime somnolence    Follow up: Pending referrals

## 2024-01-02 ENCOUNTER — Ambulatory Visit: Payer: 59 | Admitting: Medical

## 2024-01-27 ENCOUNTER — Other Ambulatory Visit: Payer: Self-pay

## 2024-01-27 ENCOUNTER — Telehealth: Payer: Self-pay | Admitting: Medical

## 2024-01-27 DIAGNOSIS — R400 Somnolence: Secondary | ICD-10-CM

## 2024-01-27 DIAGNOSIS — R0683 Snoring: Secondary | ICD-10-CM

## 2024-01-27 NOTE — Telephone Encounter (Signed)
 I received a call from the patient about a home sleep study and that he has not heard anything back. The notes on the study say for it to go through [facility]. I believe you guys typically send those to [facility] in office as I have never done it before. If someone could tell me the steps, I can try to do it too. I told the patient I would call him back when I had more information. Please advise.

## 2024-01-27 NOTE — Telephone Encounter (Signed)
I have put in a new sleep study and patient has been notified

## 2024-01-29 DIAGNOSIS — L218 Other seborrheic dermatitis: Secondary | ICD-10-CM | POA: Diagnosis not present

## 2024-03-15 ENCOUNTER — Ambulatory Visit (HOSPITAL_BASED_OUTPATIENT_CLINIC_OR_DEPARTMENT_OTHER): Payer: Commercial Managed Care - PPO | Attending: Medical | Admitting: Internal Medicine

## 2024-03-15 VITALS — Ht 67.0 in | Wt 215.0 lb

## 2024-03-15 DIAGNOSIS — R0683 Snoring: Secondary | ICD-10-CM | POA: Insufficient documentation

## 2024-03-15 DIAGNOSIS — R400 Somnolence: Secondary | ICD-10-CM

## 2024-03-15 DIAGNOSIS — G4733 Obstructive sleep apnea (adult) (pediatric): Secondary | ICD-10-CM | POA: Insufficient documentation

## 2024-03-27 DIAGNOSIS — R0683 Snoring: Secondary | ICD-10-CM | POA: Diagnosis not present

## 2024-03-27 DIAGNOSIS — R400 Somnolence: Secondary | ICD-10-CM | POA: Diagnosis not present

## 2024-03-27 NOTE — Procedures (Signed)
 [facility] [address] [place], [place] [address] Tel: [phone]   Fax: [phone]  Home Sleep Test Interpretation  Patient Name: Randy Andrews, Randy Andrews Date: 03/15/2024  Date of Birth: [birth year] Study Type: HST  Age: 28 year MRN #: [id number]  Sex: Male Interpreting Physician: Jetty Duhamel [id number]  Height: 5\' 7"  Referring Physician: Crosby Oyster, PA-C  Weight: 215.0 lbs Recording Tech: [place] Sink RRT RPSGT RST  BMI: 33.7 Scoring Tech: Holly Neeriemer RPSGT RST  ESS: 10 Neck Size: -   Indications for Polysomnography The patient is a 28 year-old Male who is 5\' 7"  and weighs 215.0 lbs. His BMI equals 33.7.  A home sleep apnea test was performed to evaluate for -OSA.  Medication  None reported   Polysomnogram Data A home sleep test recorded the standard physiologic parameters including EKG, nasal and oral airflow.  Respiratory parameters of chest and abdominal movements were recorded with Respiratory Inductance Plethysmography belts.  Oxygen saturation was recorded by pulse oximetry.   Study Architecture The total recording time of the polysomnogram was 373.0 minutes.  The total monitoring time was 373.5 minutes.  Time spent in Supine position was 226.0 minutes.   Respiratory Events The study revealed a presence of 92 obstructive, 6 central, and - mixed apneas resulting in an Apnea index of 15.7 events per hour.  There were 29 hypopneas (>=3% desaturation and/or arousal) resulting in an Apnea\Hypopnea Index (AHI >=3% desaturation and/or arousal) of 20.4 events per hour.  There were 14 hypopneas (>=4% desaturation) resulting in an Apnea\Hypopnea Index (AHI >=4% desaturation) of 18.0 events per hour.  There were - Respiratory Effort Related Arousals resulting in a RERA index of - events per hour. The Respiratory Disturbance Index is 20.4 events per hour.  The snore index was 1.9 events per hour.  Mean oxygen saturation was  96.9%.  The lowest oxygen saturation during monitoring time was 86.0%.  Time spent <=88% oxygen saturation was 1.0 minutes (0.3%).  Cardiac Summary The average pulse rate was 63.6 bpm.  The minimum pulse rate was 51.0 bpm while the maximum pulse rate was 93.0 bpm.  Cardiac rhythm was normal/abnormal.  Comment: Moderate obstructive sleep apnea, AHI (4%) 18/hr. Snoring with oxygen desaturation to a nadir of 86% and mean 96.9%.  Diagnosis: Obstructive sleep apnea   Recommendations: Suggest CPAP titration sleep study or autopap. Other options would be based on clinical judgment.   This study was personally reviewed and electronically signed by: Jetty Duhamel, MD Accredited Board Certified in Sleep Medicine Date/Time:  03/27/24   12:20  Study Overview  Recording Time: 499.9 min. Monitoring Time: 373.5 min.  Analysis Start:  10:26:47 PM Supine Time: 226.0 min.  Analysis Stop:  04:39:47 AM     Study Summary   Count Index Longest Event Duration  Apneas & Hypopneas: 127 20.4  Apneas: 49.1 sec.     Hypopneas: 52.5 sec.  RERAs: - - - sec.  Desaturations: 125 20.1 118.0 sec.  Snores: 12 1.9 4.0 sec.    Minimum Oxygen Saturation: 86.0%    Respiratory Summary   Total Duration Supine Non-Supine   Count Index Average Longest Count Index Count Index  Obstructive Apnea 92 14.8 23.5 49.1 86 22.8 6 2.4   Mixed Apnea - - - - - - - -   Central Apnea 6 1.0 18.4 26.6 6 1.6 - -   Total Apneas 98 15.7 23.2 49.1 92 24.4 6 2.4  Hypopneas 3% 29 4.7 N.A. N.A. 12 3.2 17 6.9   Apneas & Hyp. 3% 127 20.4 N.A. N.A. 104 27.6 23 9.4            Hypopneas 4% 14 2.2 N.A. N.A. 9 2.4 5 2.0  Apneas & Hyp. 4% 112 18.0 N.A. N.A. 101 26.8 11 4.5             RERAs - - - - - - - -  RDI 127 20.4 N.A. N.A. 104 27.6 23 9.4   Oxygen Saturation Summary   Total Supine Non-Supine  Average SpO2 96.9% 97.2% 96.4%  Minimum SpO2 86.0% 86.0% 87.0%   Maximum SpO2 100.0% 100.0% 99.0%   Oxygen Saturation  Distribution  Range (%) Time in range (min) Time in range (%)  90.0 - 100.0 367.7 99.0%  80.0 - 90.0 2.8 0.7%  70.0 - 80.0 - -  60.0 - 70.0 - -  50.0 - 60.0 - -  0.0 - 50.0 - -  Time Spent <=88% SpO2  Range (%) Time in range (min) Time in range (%)  0.0 - 88.0 1.0 0.3%  Cardiac Summary   Total Supine Non-Supine  Average Pulse Rate (BPM) 63.6 65.4 60.8  Minimum Pulse Rate (BPM) 51.0 51.0 51.0  Maximum Pulse Rate (BPM) 93.0 89.0 93.0                    Technologist Comments  -                      Ella Bodo, Biomedical engineer of Sleep Medicine  ELECTRONICALLY SIGNED ON:  03/27/2024, 12:15 PM [facility] PH: [phone]   FX: [phone] ACCREDITED BY THE AMERICAN ACADEMY OF SLEEP MEDICINE

## 2024-04-13 ENCOUNTER — Telehealth: Admitting: Medical

## 2024-04-13 ENCOUNTER — Encounter: Payer: Self-pay | Admitting: Medical

## 2024-04-13 VITALS — Ht 67.0 in | Wt 215.0 lb

## 2024-04-13 DIAGNOSIS — G4733 Obstructive sleep apnea (adult) (pediatric): Secondary | ICD-10-CM | POA: Diagnosis not present

## 2024-04-13 DIAGNOSIS — R0683 Snoring: Secondary | ICD-10-CM | POA: Diagnosis not present

## 2024-04-13 DIAGNOSIS — R400 Somnolence: Secondary | ICD-10-CM | POA: Diagnosis not present

## 2024-04-13 DIAGNOSIS — Z6833 Body mass index (BMI) 33.0-33.9, adult: Secondary | ICD-10-CM

## 2024-04-13 NOTE — Progress Notes (Signed)
 Subjective: This visit type was conducted due to national recommendations for restrictions regarding the COVID-19 Pandemic (e.g. social distancing) in an effort to limit this patient's exposure and mitigate transmission in our community.  Due to their co-morbid illnesses, this patient is at least at moderate risk for complications without adequate follow up.  This format is felt to be most appropriate for this patient at this time.    Documentation for virtual audio and video telecommunications through [facility] encounter:  The patient was located at home. The provider was located in the office. The patient did consent to this visit and is aware of possible charges through their insurance for this visit.  The other persons participating in this telemedicine service were none. Time spent on call was 15 minutes and in review of previous records >25 minutes total.  This virtual service is not related to other E/M service within previous 7 days.     Randy Andrews is a 28 y.o. male who presents for Chief Complaint  Patient presents with   Follow-up    DIscuss sleep study results.      Virtual consult to discuss recent sleep study.  We discussed sleep concerns even in 2023 but things fell through the cracks and sleep study did not happen until recently.  His concerns include daytime somnolence, snoring, decreased energy  No other aggravating or relieving factors.    No other c/o.  Past Medical History:  Diagnosis Date   Elevated LDL cholesterol level 06/05/2021   Current Outpatient Medications on File Prior to Visit  Medication Sig Dispense Refill   Fluocinolone  Acetonide 0.01 % SHAM Apply 1 Application topically 3 (three) times a week. (Patient not taking: Reported on 04/13/2024) 120 mL 1   ketoconazole  (NIZORAL ) 2 % shampoo Apply 1 Application topically 2 (two) times a week. (Patient not taking: Reported on 04/13/2024) 120 mL 1   No current facility-administered medications on file  prior to visit.     The following portions of the patient's history were reviewed and updated as appropriate: allergies, current medications, past family history, past medical history, past social history, past surgical history and problem list.  ROS Otherwise as in subjective above    Objective: Ht 5\' 7"  (1.702 m)   Wt 215 lb (97.5 kg)   BMI 33.67 kg/m   General appearance: alert, no distress, well developed, well nourished    Assessment: Encounter Diagnoses  Name Primary?   OSA (obstructive sleep apnea) Yes   Daytime somnolence    Snoring    BMI 33.0-33.9,adult       Plan: We discussed concerns of abnormal recent sleep study.  I reviewed his sleep study from 03/22/24.   Result was moderate OSA.  Discussed diagnosis, possible complications  Discussed need for weight loss, healthy diet and exercise, consider raising head of bed. Avoid sedating substances.    Discussed oral airway device.   Discussed trial of CPAP. Referral for trial of CPAP.      Randy "Marv" was seen today for follow-up.  Diagnoses and all orders for this visit:  OSA (obstructive sleep apnea)  Daytime somnolence  Snoring  BMI 33.0-33.9,adult    Follow up: Pending CPAP trial, 48mo

## 2024-04-15 NOTE — Addendum Note (Signed)
 Addended by: Charliene Conte A on: 04/15/2024 12:00 PM   Modules accepted: Orders

## 2024-04-15 NOTE — Progress Notes (Signed)
 Ordered and sent to [facility]

## 2024-04-30 ENCOUNTER — Ambulatory Visit: Admitting: Medical

## 2024-08-19 ENCOUNTER — Ambulatory Visit (INDEPENDENT_AMBULATORY_CARE_PROVIDER_SITE_OTHER): Payer: 59 | Admitting: Medical

## 2024-08-19 VITALS — BP 122/80 | HR 63 | Ht 67.5 in | Wt 211.2 lb

## 2024-08-19 DIAGNOSIS — Z0000 Encounter for general adult medical examination without abnormal findings: Secondary | ICD-10-CM

## 2024-08-19 DIAGNOSIS — Z13220 Encounter for screening for lipoid disorders: Secondary | ICD-10-CM | POA: Diagnosis not present

## 2024-08-19 DIAGNOSIS — Z113 Encounter for screening for infections with a predominantly sexual mode of transmission: Secondary | ICD-10-CM | POA: Diagnosis not present

## 2024-08-19 DIAGNOSIS — G4733 Obstructive sleep apnea (adult) (pediatric): Secondary | ICD-10-CM

## 2024-08-19 DIAGNOSIS — Z131 Encounter for screening for diabetes mellitus: Secondary | ICD-10-CM

## 2024-08-19 DIAGNOSIS — Z7185 Encounter for immunization safety counseling: Secondary | ICD-10-CM | POA: Diagnosis not present

## 2024-08-19 DIAGNOSIS — Z136 Encounter for screening for cardiovascular disorders: Secondary | ICD-10-CM | POA: Diagnosis not present

## 2024-08-19 NOTE — Progress Notes (Addendum)
 Subjective:   HPI  Randy Andrews is a 28 y.o. male who presents for Chief Complaint  Patient presents with   Annual Exam    Nonfasting cpe, No concerns    Patient Care Team: Donyea Beverlin, Alm GORMAN RIGGERS as PCP - General (Family Medicine)   Concerns: none   Reviewed their medical, surgical, family, social, medication, and allergy history and updated chart as appropriate.  No Known Allergies  Past Medical History:  Diagnosis Date   Elevated LDL cholesterol level 06/05/2021    No current outpatient medications on file prior to visit.   No current facility-administered medications on file prior to visit.     No current outpatient medications on file.  Family History  Problem Relation Age of Onset   Depression Mother    Hyperlipidemia Father    Asthma Brother    Cancer Maternal Grandmother    Diabetes type II Neg Hx    Heart attack Neg Hx    Stroke Neg Hx    Hypertension Neg Hx    Prostate cancer Neg Hx     Past Surgical History:  Procedure Laterality Date   MANDIBLE FRACTURE SURGERY      Review of Systems  Constitutional:  Negative for chills, fever, malaise/fatigue and weight loss.  HENT:  Negative for congestion, ear pain, hearing loss, sore throat and tinnitus.   Eyes:  Negative for blurred vision, pain and redness.  Respiratory:  Negative for cough, hemoptysis and shortness of breath.   Cardiovascular:  Negative for chest pain, palpitations, orthopnea, claudication and leg swelling.  Gastrointestinal:  Negative for abdominal pain, blood in stool, constipation, diarrhea, nausea and vomiting.  Genitourinary:  Negative for dysuria, flank pain, frequency, hematuria and urgency.  Musculoskeletal:  Negative for falls, joint pain and myalgias.  Skin:  Negative for itching and rash.  Neurological:  Negative for dizziness, tingling, speech change, weakness and headaches.  Endo/Heme/Allergies:  Negative for polydipsia. Does not bruise/bleed easily.   Psychiatric/Behavioral:  Negative for depression and memory loss. The patient is not nervous/anxious and does not have insomnia.       Objective:  BP 122/80   Pulse 63   Ht 5' 7.5 (1.715 m)   Wt 211 lb 3.2 oz (95.8 kg)   SpO2 99%   BMI 32.59 kg/m   Wt Readings from Last 3 Encounters:  08/19/24 211 lb 3.2 oz (95.8 kg)  04/13/24 215 lb (97.5 kg)  03/15/24 215 lb (97.5 kg)    General appearance: alert, no distress, WD/WN, male Skin: unremarkable HEENT: normocephalic, conjunctiva/corneas normal, sclerae anicteric, PERRLA, EOMi, nares patent, no discharge or erythema, pharynx normal Oral cavity: MMM, tongue normal, teeth normal Neck: supple, no lymphadenopathy, no thyromegaly, no masses, normal ROM, no bruits Chest: non tender, normal shape and expansion Heart: RRR, normal S1, S2, no murmurs Lungs: CTA bilaterally, no wheezes, rhonchi, or rales Abdomen: +bs, soft, non tender, non distended, no masses, no hepatomegaly, no splenomegaly, no bruits Back: non tender, normal ROM, no scoliosis Musculoskeletal: upper extremities non tender, no obvious deformity, normal ROM throughout, lower extremities non tender, no obvious deformity, normal ROM throughout Extremities: no edema, no cyanosis, no clubbing Pulses: 2+ symmetric, upper and lower extremities, normal cap refill Neurological: alert, oriented x 3, CN2-12 intact, strength normal upper extremities and lower extremities, sensation normal throughout, DTRs 2+ throughout, no cerebellar signs, gait normal Psychiatric: normal affect, behavior normal, pleasant  GU: normal male external genitalia,uncircumcised, nontender, no masses, no hernia, no lymphadenopathy Rectal: deferred  Assessment and Plan :   Encounter Diagnoses  Name Primary?   Encounter for health maintenance examination in adult Yes   Vaccine counseling    Encounter for lipid screening for cardiovascular disease    Screen for STD (sexually transmitted disease)     Screening for diabetes mellitus    OSA (obstructive sleep apnea)      This visit was a preventative care visit, also known as wellness visit or routine physical.   Topics typically include healthy lifestyle, diet, exercise, preventative care, vaccinations, sick and well care, proper use of emergency dept and after hours care, as well as other concerns.     Separate significant issues discussed: Sleep apnea-we discussed a sleep study that was abnormal earlier this year.  His April visit he was going to initiate CPAP but the co-pay was $500 and he could not go for that at that time.  He is currently working on avoiding sleeping supine and trying to lose weight.  His family members say that when he is sleeping on his side he does not snore near like he does when he is sleeping on his back.  He does not necessarily feel extremely tired at this point.  We discussed different treatment options again in case he wants to try any other specific treatment in addition to lifestyle and exercise  Continue efforts to lose weight through healthy diet and exercise.   General Recommendations: Continue to return yearly for your annual wellness and preventative care visits.  This gives us  a chance to discuss healthy lifestyle, exercise, vaccinations, review your chart record, and perform screenings where appropriate.  I recommend you see your eye doctor yearly for routine vision care.  I recommend you see your dentist yearly for routine dental care including hygiene visits twice yearly.   Vaccination  Immunization History  Administered Date(s) Administered   PFIZER(Purple Top)SARS-COV-2 Vaccination 12/28/2019, 01/17/2020, 09/22/2020   PPD Test 05/08/2018   Tdap 06/20/2020    Vaccine recommendations: Consider yearly flu vaccine  Consider HPV vaccine   Screening for cancer: Colon cancer screening: Age 71  Testicular cancer screening You should do a monthly self testicular exam if you are between  38-18 years old, and we typically do a testicular exam on the yearly physical for this same age group.   Prostate Cancer screening: The recommended prostate cancer screening test is a blood test called the prostate-specific antigen (PSA) test. PSA is a protein that is made in the prostate. As you age, your prostate naturally produces more PSA. Abnormally high PSA levels may be caused by: Prostate cancer. An enlarged prostate that is not caused by cancer (benign prostatic hyperplasia, or BPH). This condition is very common in older men. A prostate gland infection (prostatitis) or urinary tract infection. Certain medicines such as male hormones (like testosterone) or other medicines that raise testosterone levels. A rectal exam may be done as part of prostate cancer screening to help provide information about the size of your prostate gland. When a rectal exam is performed, it should be done after the PSA level is drawn to avoid any effect on the results.   Skin cancer screening: Check your skin regularly for new changes, growing lesions, or other lesions of concern Come in for evaluation if you have skin lesions of concern.   Lung cancer screening: If you have a greater than 20 pack year history of tobacco use, then you may qualify for lung cancer screening with a chest CT scan.   Please  call your insurance company to inquire about coverage for this test.   Pancreatic cancer:  no current screening test is available or routinely recommended. (risk factors: smoking, overweight or obese, diabetes, chronic pancreatitis, work exposure - dry cleaning, metal working, 28yo>, M>F, Tree surgeon, family hx/o, hereditary breast, ovarian, melanoma, lynch, peutz-jeghers).  Symptoms: jaundice, dark urine, light color or greasy stools, itchy skin, belly or back pain, weight loss, poor appetite, nausea, vomiting, liver enlargement, DVT/blood clots.   We currently don't have screenings for other cancers  besides breast, cervical, colon, and lung cancers.  If you have a strong family history of cancer or have other cancer screening concerns, please let me know.  Genetic testing referral is an option for individuals with high cancer risk in the family.  There are some other cancer screenings in development currently.   Bone health: Get at least 150 minutes of aerobic exercise weekly Get weight bearing exercise at least once weekly Bone density test:  A bone density test is an imaging test that uses a type of X-ray to measure the amount of calcium and other minerals in your bones. The test may be used to diagnose or screen you for a condition that causes weak or thin bones (osteoporosis), predict your risk for a broken bone (fracture), or determine how well your osteoporosis treatment is working. The bone density test is recommended for females 65 and older, or females or males <65 if certain risk factors such as thyroid  disease, long term use of steroids such as for asthma or rheumatological issues, vitamin D deficiency, estrogen deficiency, family history of osteoporosis, self or family history of fragility fracture in first degree relative.    Heart health: Get at least 150 minutes of aerobic exercise weekly Limit alcohol It is important to maintain a healthy blood pressure and healthy cholesterol numbers  Heart disease screening: Screening for heart disease includes screening for blood pressure, fasting lipids, glucose/diabetes screening, BMI height to weight ratio, reviewed of smoking status, physical activity, and diet.    Goals include blood pressure 120/80 or less, maintaining a healthy lipid/cholesterol profile, preventing diabetes or keeping diabetes numbers under good control, not smoking or using tobacco products, exercising most days per week or at least 150 minutes per week of exercise, and eating healthy variety of fruits and vegetables, healthy oils, and avoiding unhealthy food  choices like fried food, fast food, high sugar and high cholesterol foods.     Vascular disease screening: For higher risk individuals including smokers, diabetics, patients with known heart disease or high blood pressure, kidney disease, and others, screening for vascular disease or atherosclerosis of the arteries is available.  Examples may include carotid ultrasound, abdominal aortic ultrasound, ABI blood flow screening in the legs, thoracic aorta screening.   Medical care options: I recommend you continue to seek care here first for routine care.  We try really hard to have available appointments Monday through Friday daytime hours for sick visits, acute visits, and physicals.  Urgent care should be used for after hours and weekends for significant issues that cannot wait till the next day.  The emergency department should be used for significant potentially life-threatening emergencies.  The emergency department is expensive, can often have long wait times for less significant concerns, so try to utilize primary care, urgent care, or telemedicine when possible to avoid unnecessary trips to the emergency department.  Virtual visits and telemedicine have been introduced since the pandemic started in 2020, and can be convenient ways  to receive medical care.  We offer virtual appointments as well to assist you in a variety of options to seek medical care.   Legal  Take the time to do a last will and testament, Advanced Directives including Health Care Power of Attorney and Living Will documents.  Don't leave your family with burdens that can be handled ahead of time.   Spiritual and Emotional Health Keeping a healthy spiritual life can help you better manage your physical health. Your spiritual life can help you to cope with any issues that may arise with your physical health.  Balance can keep us  healthy and help us  to recover.  If you are struggling with your spiritual health there are questions  that you may want to ask yourself:  What makes me feel most complete? When do I feel most connected to the rest of the world? Where do I find the most inner strength? What am I doing when I feel whole?  Helpful tips: Being in nature. Some people feel very connected and at peace when they are walking outdoors or are outside. Helping others. Some feel the largest sense of wellbeing when they are of service to others. Being of service can take on many forms. It can be doing volunteer work, being kind to strangers, or offering a hand to a friend in need. Gratitude. Some people find they feel the most connected when they remain grateful. They may make lists of all the things they are grateful for or say a thank you out loud for all they have.    Emotional Health Are you in tune with your emotional health?  Check out this link: http://www.marquez-love.com/    Financial Health Make sure you use a budget for your personal finances Make sure you are insured against risks (health insurance, life insurance, auto insurance, etc) Save more, spend less Set financial goals If you need help in this area, good resources include counseling through [employer] or other community resources, have a meeting with a Social research officer, government, and a good resource is the [facility] Marv was seen today for annual exam.  Diagnoses and all orders for this visit:  Encounter for health maintenance examination in adult -     CBC; Future -     Comprehensive metabolic panel with GFR; Future -     Lipid panel; Future -     Hemoglobin A1c; Future -     HSV 1 and 2 Ab, IgG; Future -     RPR+HIV+GC+CT Panel; Future  Vaccine counseling  Encounter for lipid screening for cardiovascular disease -     Lipid panel; Future  Screen for STD (sexually transmitted disease) -     HSV 1 and 2 Ab, IgG; Future -     RPR+HIV+GC+CT Panel; Future  Screening for diabetes mellitus -      Hemoglobin A1c; Future  OSA (obstructive sleep apnea)      Follow-up pending labs, yearly for physical

## 2024-09-03 ENCOUNTER — Other Ambulatory Visit

## 2024-09-03 DIAGNOSIS — Z0000 Encounter for general adult medical examination without abnormal findings: Secondary | ICD-10-CM

## 2024-09-03 DIAGNOSIS — Z131 Encounter for screening for diabetes mellitus: Secondary | ICD-10-CM | POA: Diagnosis not present

## 2024-09-03 DIAGNOSIS — Z136 Encounter for screening for cardiovascular disorders: Secondary | ICD-10-CM

## 2024-09-03 DIAGNOSIS — Z13220 Encounter for screening for lipoid disorders: Secondary | ICD-10-CM | POA: Diagnosis not present

## 2024-09-03 DIAGNOSIS — Z113 Encounter for screening for infections with a predominantly sexual mode of transmission: Secondary | ICD-10-CM

## 2024-09-03 LAB — LIPID PANEL

## 2024-09-06 ENCOUNTER — Ambulatory Visit: Payer: Self-pay | Admitting: Medical

## 2024-09-06 LAB — COMPREHENSIVE METABOLIC PANEL WITH GFR
ALT: 18 IU/L (ref 0–44)
AST: 19 IU/L (ref 0–40)
Albumin: 4.6 g/dL (ref 4.3–5.2)
Alkaline Phosphatase: 101 IU/L (ref 44–121)
BUN/Creatinine Ratio: 18 (ref 9–20)
BUN: 15 mg/dL (ref 6–20)
Bilirubin Total: 0.3 mg/dL (ref 0.0–1.2)
CO2: 23 mmol/L (ref 20–29)
Calcium: 9.3 mg/dL (ref 8.7–10.2)
Chloride: 104 mmol/L (ref 96–106)
Creatinine, Ser: 0.83 mg/dL (ref 0.76–1.27)
Globulin, Total: 2.8 g/dL (ref 1.5–4.5)
Glucose: 89 mg/dL (ref 70–99)
Potassium: 4.5 mmol/L (ref 3.5–5.2)
Sodium: 139 mmol/L (ref 134–144)
Total Protein: 7.4 g/dL (ref 6.0–8.5)
eGFR: 122 mL/min/1.73 (ref >59)

## 2024-09-06 LAB — LIPID PANEL
Chol/HDL Ratio: 4.4 ratio (ref 0.0–5.0)
Cholesterol, Total: 211 mg/dL — ABNORMAL HIGH (ref 100–199)
HDL: 48 mg/dL (ref >39)
LDL Chol Calc (NIH): 145 mg/dL — ABNORMAL HIGH (ref 0–99)
Triglycerides: 101 mg/dL (ref 0–149)
VLDL Cholesterol Cal: 18 mg/dL (ref 5–40)

## 2024-09-06 LAB — RPR+HIV+GC+CT PANEL
Chlamydia trachomatis, NAA: NEGATIVE
HIV Screen 4th Generation wRfx: NONREACTIVE
Neisseria Gonorrhoeae by PCR: NEGATIVE
RPR Ser Ql: NONREACTIVE

## 2024-09-06 LAB — HSV 1 AND 2 AB, IGG
HSV 1 Glycoprotein G Ab, IgG: NONREACTIVE
HSV 2 IgG, Type Spec: NONREACTIVE

## 2024-09-06 LAB — CBC
Hematocrit: 44.5 % (ref 37.5–51.0)
Hemoglobin: 14.5 g/dL (ref 13.0–17.7)
MCH: 28.7 pg (ref 26.6–33.0)
MCHC: 32.6 g/dL (ref 31.5–35.7)
MCV: 88 fL (ref 79–97)
Platelets: 261 x10E3/uL (ref 150–450)
RBC: 5.06 x10E6/uL (ref 4.14–5.80)
RDW: 12.5 % (ref 11.6–15.4)
WBC: 6.3 x10E3/uL (ref 3.4–10.8)

## 2024-09-06 LAB — HEMOGLOBIN A1C
Est. average glucose Bld gHb Est-mCnc: 105 mg/dL
Hgb A1c MFr Bld: 5.3 % (ref 4.8–5.6)

## 2024-09-06 NOTE — Progress Notes (Signed)
 Results thru my chart

## 2024-09-06 NOTE — Progress Notes (Signed)
 Results through MyChart

## 2025-09-01 ENCOUNTER — Encounter: Payer: Self-pay | Admitting: Medical
